# Patient Record
Sex: Male | Born: 1950 | Race: Black or African American | Hispanic: No | Marital: Married | Smoking: Never smoker
Health system: Southern US, Community
[De-identification: ages and names within clinical notes are randomized; demographics above are authoritative.]

## PROBLEM LIST (undated history)

## (undated) DIAGNOSIS — I10 Essential (primary) hypertension: Secondary | ICD-10-CM

## (undated) DIAGNOSIS — E785 Hyperlipidemia, unspecified: Secondary | ICD-10-CM

## (undated) DIAGNOSIS — I2510 Atherosclerotic heart disease of native coronary artery without angina pectoris: Secondary | ICD-10-CM

## (undated) HISTORY — PX: PTCA: SHX146

## (undated) HISTORY — PX: CORONARY ARTERY BYPASS GRAFT: SHX141

---

## 2009-05-31 DEATH — deceased

## 2012-10-19 ENCOUNTER — Encounter (HOSPITAL_BASED_OUTPATIENT_CLINIC_OR_DEPARTMENT_OTHER): Payer: Self-pay

## 2012-10-19 ENCOUNTER — Emergency Department (HOSPITAL_BASED_OUTPATIENT_CLINIC_OR_DEPARTMENT_OTHER): Payer: Medicare Other

## 2012-10-19 ENCOUNTER — Emergency Department (HOSPITAL_BASED_OUTPATIENT_CLINIC_OR_DEPARTMENT_OTHER)
Admission: EM | Admit: 2012-10-19 | Discharge: 2012-10-19 | Disposition: A | Discharge status: Home / Self Care | Payer: Medicare Other | Attending: Emergency Medicine | Admitting: Emergency Medicine

## 2012-10-19 DIAGNOSIS — R52 Pain, unspecified: Secondary | ICD-10-CM | POA: Insufficient documentation

## 2012-10-19 DIAGNOSIS — I10 Essential (primary) hypertension: Secondary | ICD-10-CM | POA: Insufficient documentation

## 2012-10-19 DIAGNOSIS — M545 Low back pain, unspecified: Secondary | ICD-10-CM | POA: Insufficient documentation

## 2012-10-19 DIAGNOSIS — Z951 Presence of aortocoronary bypass graft: Secondary | ICD-10-CM | POA: Insufficient documentation

## 2012-10-19 DIAGNOSIS — Z7902 Long term (current) use of antithrombotics/antiplatelets: Secondary | ICD-10-CM | POA: Insufficient documentation

## 2012-10-19 DIAGNOSIS — I2510 Atherosclerotic heart disease of native coronary artery without angina pectoris: Secondary | ICD-10-CM | POA: Insufficient documentation

## 2012-10-19 DIAGNOSIS — E785 Hyperlipidemia, unspecified: Secondary | ICD-10-CM | POA: Insufficient documentation

## 2012-10-19 HISTORY — DX: Hyperlipidemia, unspecified: E78.5

## 2012-10-19 HISTORY — DX: Atherosclerotic heart disease of native coronary artery without angina pectoris: I25.10

## 2012-10-19 HISTORY — DX: Essential (primary) hypertension: I10

## 2012-10-19 LAB — URINALYSIS, ROUTINE W REFLEX MICROSCOPIC
Bilirubin Urine: NEGATIVE
Hgb urine dipstick: NEGATIVE
Ketones, ur: NEGATIVE mg/dL
Nitrite: NEGATIVE
Urobilinogen, UA: 0.2 mg/dL (ref 0.0–1.0)
pH: 5.5 (ref 5.0–8.0)

## 2012-10-19 LAB — BASIC METABOLIC PANEL
CO2: 30 mEq/L (ref 19–32)
Calcium: 10.1 mg/dL (ref 8.4–10.5)
Creatinine, Ser: 0.8 mg/dL (ref 0.50–1.35)
GFR calc non Af Amer: >90 mL/min (ref >90)
Glucose, Bld: 104 mg/dL — ABNORMAL HIGH (ref 70–99)
Sodium: 141 mEq/L (ref 135–145)

## 2012-10-19 LAB — CBC
Hemoglobin: 14.1 g/dL (ref 13.0–17.0)
MCH: 31.3 pg (ref 26.0–34.0)
MCHC: 34.3 g/dL (ref 30.0–36.0)
MCV: 91.1 fL (ref 78.0–100.0)
RBC: 4.51 MIL/uL (ref 4.22–5.81)

## 2012-10-19 MED ORDER — ORPHENADRINE CITRATE 30 MG/ML IJ SOLN
60.0000 mg | Freq: Once | INTRAMUSCULAR | Status: DC
  Filled 2012-10-19: qty 2

## 2012-10-19 MED ORDER — HYDROMORPHONE HCL PF 1 MG/ML IJ SOLN
1.0000 mg | Freq: Once | INTRAMUSCULAR | Status: AC
  Administered 2012-10-19: 1 mg via INTRAVENOUS
  Filled 2012-10-19: qty 1

## 2012-10-19 MED ORDER — OXYCODONE-ACETAMINOPHEN 5-325 MG PO TABS: 1.0000 | ORAL_TABLET | ORAL | Status: AC | PRN

## 2012-10-19 NOTE — ED Notes (Signed)
MD at bedside. 

## 2012-10-19 NOTE — ED Provider Notes (Signed)
CSN: [id number]     Arrival date & time 10/19/12  4132 History     First MD Initiated Contact with Patient 10/19/12 [phone]     Chief Complaint  Patient presents with  . Back Pain   (Consider location/radiation/quality/duration/timing/severity/associated sxs/prior Treatment) Patient is a 62 y.o. male presenting with back pain. The history is provided by the patient.  Back Pain Location:  Lumbar spine Quality:  Aching Radiates to:  Does not radiate Pain severity:  Severe Pain is:  Same all the time Onset quality:  Gradual Duration:  2 days Timing:  Constant Progression:  Worsening Chronicity:  New Context: not lifting heavy objects, not MCA, not recent illness and not recent injury   Relieved by: partially by sitting up. Exacerbated by: certain positions. Associated symptoms: no abdominal pain, no bladder incontinence, no bowel incontinence, no dysuria, no fever, no leg pain, no numbness, no perianal numbness, no weakness and no weight loss   Risk factors: no hx of cancer     Past Medical History  Diagnosis Date  . Coronary artery disease   . Hypertension   . Hyperlipidemia    Past Surgical History  Procedure Laterality Date  . Coronary artery bypass graft    . Ptca     No family history on file. History  Substance Use Topics  . Smoking status: Never Smoker   . Smokeless tobacco: Not on file  . Alcohol Use: No    Review of Systems  Constitutional: Negative for fever, chills, weight loss and unexpected weight change.  Gastrointestinal: Negative for vomiting, abdominal pain and bowel incontinence.  Genitourinary: Negative for bladder incontinence, dysuria, hematuria, flank pain and decreased urine volume.  Musculoskeletal: Positive for back pain.  Neurological: Negative for weakness and numbness.  All other systems reviewed and are negative.    Allergies  Review of patient's allergies indicates no known allergies.  Home Medications   Current Outpatient Rx   Name  Route  Sig  Dispense  Refill  . Atorvastatin Calcium (LIPITOR PO)   Oral   Take by mouth.         . clopidogrel (PLAVIX) 75 MG tablet   Oral   Take 75 mg by mouth daily.         Marland Kitchen METOPROLOL TARTRATE PO   Oral   Take by mouth.          BP 150/89  Pulse 69  Temp(Src) 98.1 F (36.7 C) (Oral)  Resp 16  Ht 5\' 4"  (1.626 m)  Wt 190 lb (86.183 kg)  BMI 32.6 kg/m2  SpO2 97% Physical Exam  Nursing note and vitals reviewed. Constitutional: He is oriented to person, place, and time. He appears well-developed and well-nourished.  HENT:  Head: Normocephalic and atraumatic.  Right Ear: External ear normal.  Left Ear: External ear normal.  Nose: Nose normal.  Eyes: Right eye exhibits no discharge. Left eye exhibits no discharge.  Neck: Neck supple.  Cardiovascular: Normal rate, regular rhythm, normal heart sounds and intact distal pulses.   Pulmonary/Chest: Effort normal and breath sounds normal.  Abdominal: Soft. He exhibits no distension. There is no tenderness.  Genitourinary: Rectum normal. Rectal exam shows anal tone normal.  Musculoskeletal: He exhibits no edema.       Lumbar back: He exhibits tenderness (midline and left sided).  Neurological: He is alert and oriented to person, place, and time. He has normal strength. No sensory deficit. He exhibits normal muscle tone.  Reflex Scores:  Patellar reflexes are 2+ on the right side and 2+ on the left side.      Achilles reflexes are 2+ on the right side and 2+ on the left side. Gait with mild limp associated with pain but otherwise normal  Skin: Skin is warm and dry.    ED Course   Procedures (including critical care time)  Labs Reviewed  URINALYSIS, ROUTINE W REFLEX MICROSCOPIC - Abnormal; Notable for the following:    APPearance CLOUDY (*)    All other components within normal limits  BASIC METABOLIC PANEL - Abnormal; Notable for the following:    Glucose, Bld 104 (*)    All other components within  normal limits  CBC   Dg Lumbar Spine Complete  10/19/2012   *RADIOLOGY REPORT*  Clinical Data: Acute low back pain for 1 week, no injury, some left radiculopathy  LUMBAR SPINE - COMPLETE 4+ VIEW  Comparison: None  Findings: Five non-rib bearing lumbar vertebrae. Osseous mineralization normal. Minimal scattered endplate spur formation. Vertebral body and disc space heights fairly well maintained. No acute fracture, subluxation or bone destruction. Extensive atherosclerotic calcification. Facet degenerative changes lower lumbar spine. No spondylolysis. SI joints symmetric.  IMPRESSION: Minimal degenerative disc disease changes. No acute abnormalities.   Original Report Authenticated By: Ulyses Southward, M.D.   US Aorta  10/19/2012   *RADIOLOGY REPORT*  Clinical Data:  Low back pain  ULTRASOUND OF ABDOMINAL AORTA  Technique:  Ultrasound examination of the abdominal aorta was performed to evaluate for abdominal aortic aneurysm.  Comparison: None.  Abdominal Aorta:  No aneurysm identified.        Maximum AP diameter:  2.8 cm.       Maximum TRV diameter:  2.7 cm.  The iliac arteries measure 1.2-1.3 cm bilaterally.  IMPRESSION: No evidence of abdominal aortic aneurysm.   Original Report Authenticated By: Alcide Clever, M.D.   1. Acute low back pain     MDM  62 year old male with atraumatic, gradually worsening low back pain. No radiation. No neurologic symptoms and neuro exam is normal. Rectal exam normal. Only red flag is age. Due to this x-ray was obtained. Xray shows no acute abnormalities. Ultrasound of the aorta shows no AAA. UA neg, no hematuria to suggest atypical stone. Appears to be MSK in etiology, will treat with PO pain meds as outpatient.  Audree Camel, MD 10/19/12 [phone]

## 2012-10-19 NOTE — ED Notes (Signed)
Patient transported to X-ray 

## 2012-10-19 NOTE — ED Notes (Signed)
Pt reports low back pain x 2 days. 

## 2012-10-22 ENCOUNTER — Emergency Department (HOSPITAL_BASED_OUTPATIENT_CLINIC_OR_DEPARTMENT_OTHER)
Admission: EM | Admit: 2012-10-22 | Discharge: 2012-10-22 | Disposition: A | Discharge status: Home / Self Care | Payer: Medicare Other | Attending: Emergency Medicine | Admitting: Emergency Medicine

## 2012-10-22 ENCOUNTER — Encounter (HOSPITAL_BASED_OUTPATIENT_CLINIC_OR_DEPARTMENT_OTHER): Payer: Self-pay | Admitting: *Deleted

## 2012-10-22 DIAGNOSIS — M549 Dorsalgia, unspecified: Secondary | ICD-10-CM

## 2012-10-22 DIAGNOSIS — M545 Low back pain, unspecified: Secondary | ICD-10-CM | POA: Insufficient documentation

## 2012-10-22 DIAGNOSIS — Z951 Presence of aortocoronary bypass graft: Secondary | ICD-10-CM | POA: Insufficient documentation

## 2012-10-22 DIAGNOSIS — Z79899 Other long term (current) drug therapy: Secondary | ICD-10-CM | POA: Insufficient documentation

## 2012-10-22 DIAGNOSIS — Z7902 Long term (current) use of antithrombotics/antiplatelets: Secondary | ICD-10-CM | POA: Insufficient documentation

## 2012-10-22 DIAGNOSIS — E785 Hyperlipidemia, unspecified: Secondary | ICD-10-CM | POA: Insufficient documentation

## 2012-10-22 DIAGNOSIS — I10 Essential (primary) hypertension: Secondary | ICD-10-CM | POA: Insufficient documentation

## 2012-10-22 DIAGNOSIS — I2510 Atherosclerotic heart disease of native coronary artery without angina pectoris: Secondary | ICD-10-CM | POA: Insufficient documentation

## 2012-10-22 MED ORDER — CIPROFLOXACIN HCL 500 MG PO TABS: 500.0000 mg | ORAL_TABLET | Freq: Two times a day (BID) | ORAL | Status: AC

## 2012-10-22 MED ORDER — OXYCODONE-ACETAMINOPHEN 5-325 MG PO TABS: 1.0000 | ORAL_TABLET | ORAL | Status: AC | PRN

## 2012-10-22 MED ORDER — OXYCODONE-ACETAMINOPHEN 5-325 MG PO TABS
1.0000 | ORAL_TABLET | Freq: Once | ORAL | Status: AC
  Administered 2012-10-22: 1 via ORAL
  Filled 2012-10-22 (×2): qty 1

## 2012-10-22 NOTE — ED Provider Notes (Signed)
CSN: [id number]     Arrival date & time 10/22/12  1204 History     First MD Initiated Contact with Patient 10/22/12 1345     Chief Complaint  Patient presents with  . Back Pain   (Consider location/radiation/quality/duration/timing/severity/associated sxs/prior Treatment) Patient is a 62 y.o. male presenting with back pain.  Back Pain Location:  Lumbar spine Quality:  Aching Radiates to:  Does not radiate Pain severity:  Severe Onset quality:  Gradual Duration:  4 days Timing:  Constant Progression:  Unchanged Chronicity:  New Context: not lifting heavy objects, not MCA and not twisting   Relieved by:  Narcotics Worsened by:  Twisting and bending Associated symptoms: no abdominal pain, no bladder incontinence, no bowel incontinence, no dysuria, no fever, no numbness, no perianal numbness, no tingling and no weakness     Past Medical History  Diagnosis Date  . Coronary artery disease   . Hypertension   . Hyperlipidemia    Past Surgical History  Procedure Laterality Date  . Coronary artery bypass graft    . Ptca     No family history on file. History  Substance Use Topics  . Smoking status: Never Smoker   . Smokeless tobacco: Not on file  . Alcohol Use: No    Review of Systems  Constitutional: Negative for fever.  Gastrointestinal: Negative for abdominal pain and bowel incontinence.  Genitourinary: Negative for bladder incontinence and dysuria.  Musculoskeletal: Positive for back pain.  Neurological: Negative for tingling, weakness and numbness.  All other systems reviewed and are negative.    Allergies  Review of patient's allergies indicates no known allergies.  Home Medications   Current Outpatient Rx  Name  Route  Sig  Dispense  Refill  . Atorvastatin Calcium (LIPITOR PO)   Oral   Take by mouth.         . clopidogrel (PLAVIX) 75 MG tablet   Oral   Take 75 mg by mouth daily.         Marland Kitchen METOPROLOL TARTRATE PO   Oral   Take by mouth.        . oxyCODONE-acetaminophen (PERCOCET) 5-325 MG per tablet   Oral   Take 1 tablet by mouth every 4 (four) hours as needed for pain.   15 tablet   0    BP 131/78  Pulse 110  Temp(Src) 97.7 F (36.5 C) (Oral)  Resp 20  Ht 5\' 4"  (1.626 m)  Wt 190 lb (86.183 kg)  BMI 32.6 kg/m2  SpO2 99% Physical Exam  Nursing note and vitals reviewed. Constitutional: He is oriented to person, place, and time. He appears well-developed and well-nourished. No distress.  HENT:  Head: Normocephalic and atraumatic.  Mouth/Throat: Oropharynx is clear and moist.  Eyes: Conjunctivae are normal. Pupils are equal, round, and reactive to light. No scleral icterus.  Neck: Neck supple.  Cardiovascular: Normal rate, regular rhythm, normal heart sounds and intact distal pulses.   No murmur heard. Pulmonary/Chest: Effort normal and breath sounds normal. No stridor. No respiratory distress. He has no wheezes. He has no rales.  Abdominal: Soft. He exhibits no distension. There is no tenderness. There is no rigidity, no rebound, no guarding and no CVA tenderness.  Genitourinary: Rectal exam shows no mass and anal tone normal. Prostate is tender ( prostate tender but not boggy ).  Musculoskeletal: Normal range of motion. He exhibits no edema.       Lumbar back: He exhibits tenderness.  Back:  Neurological: He is alert and oriented to person, place, and time. He has normal strength. Gait normal.  Reflex Scores:      Patellar reflexes are 2+ on the right side and 2+ on the left side. Skin: Skin is warm and dry. No rash noted.  Psychiatric: He has a normal mood and affect. His behavior is normal.    ED Course   Procedures (including critical care time)  Labs Reviewed - No data to display No results found. 1. Back pain     MDM  62 yo male with 5 days of low back pain.  Evaluated in ED several days ago and had negative plain film and negative aorta ultrasound.  Labs reviewed from that visit.   Pain  persisted, but no indication for emergent MRI.  Prostate is tender and he feels that palpation reproduced pain.  He is nontoxic and afebrile, but plan to treat with abx for possible prostatitis.  I told him to follow up Monday either here or at his PCP's office for a recheck given diagnostic uncertainty.    Candyce Churn, MD 10/22/12 [phone]

## 2012-10-22 NOTE — ED Notes (Addendum)
Patient here for back pain and was seen Wednesday. Was scanned on that day. States pain isnt any better/ Patient was prescribed percocet and brought in his empty bottle.

## 2014-12-07 ENCOUNTER — Other Ambulatory Visit: Payer: Self-pay

## 2015-08-18 IMAGING — CR DG LUMBAR SPINE COMPLETE 4+V
5 series · 5 of 5 positions shown · non-contrast
Comparison: None

CLINICAL DATA: Acute low back pain for 1 week, no injury, some left
radiculopathy

LUMBAR SPINE - COMPLETE 4+ VIEW

[t l-spine a.p.]
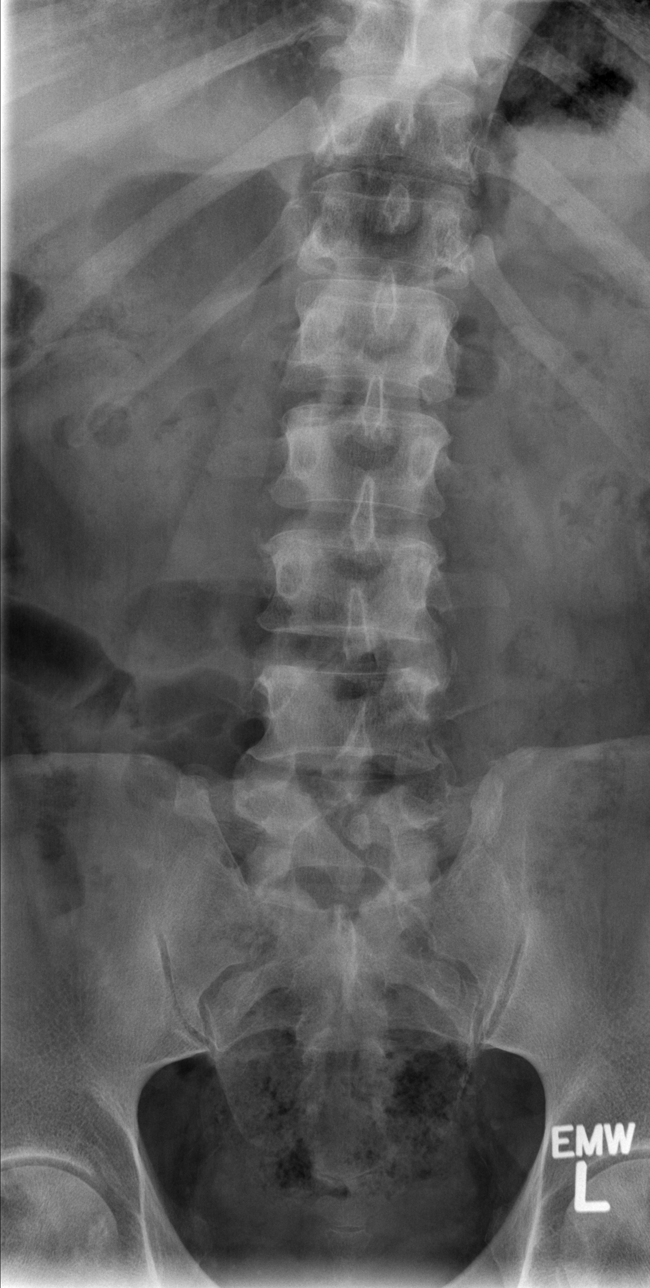

[t l-spine oblique exposure (1 of 2)]
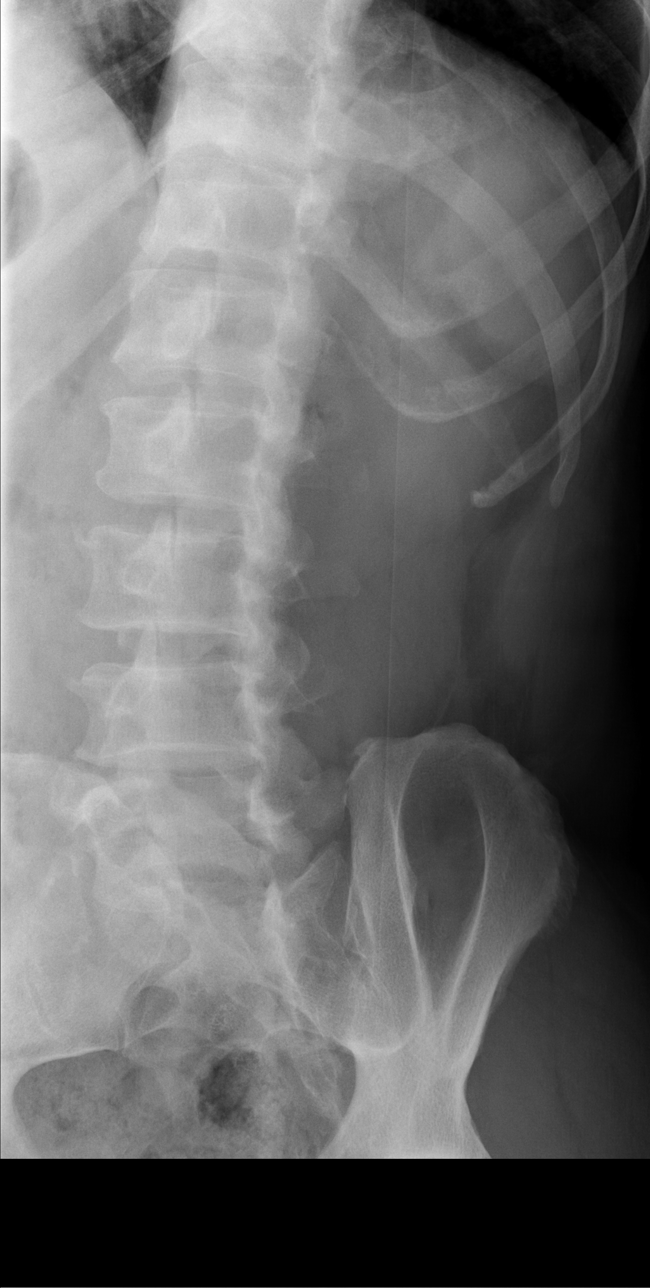

[t l-spine oblique exposure (2 of 2)]
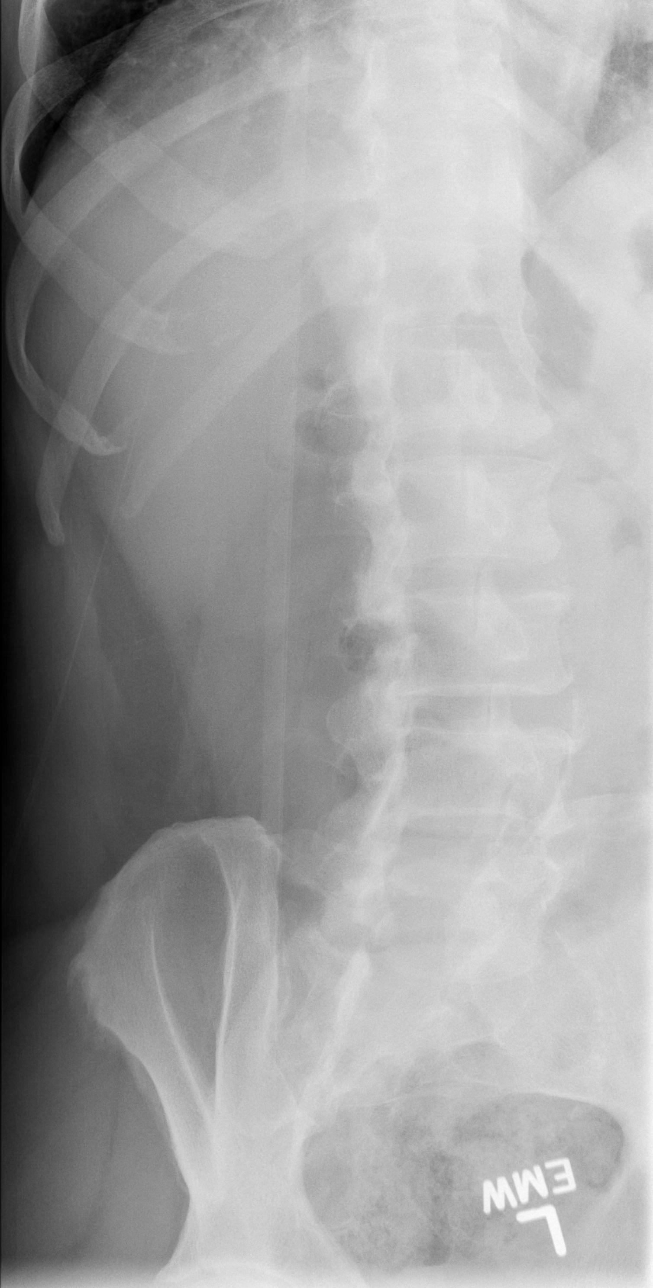

[t l-spine lat]
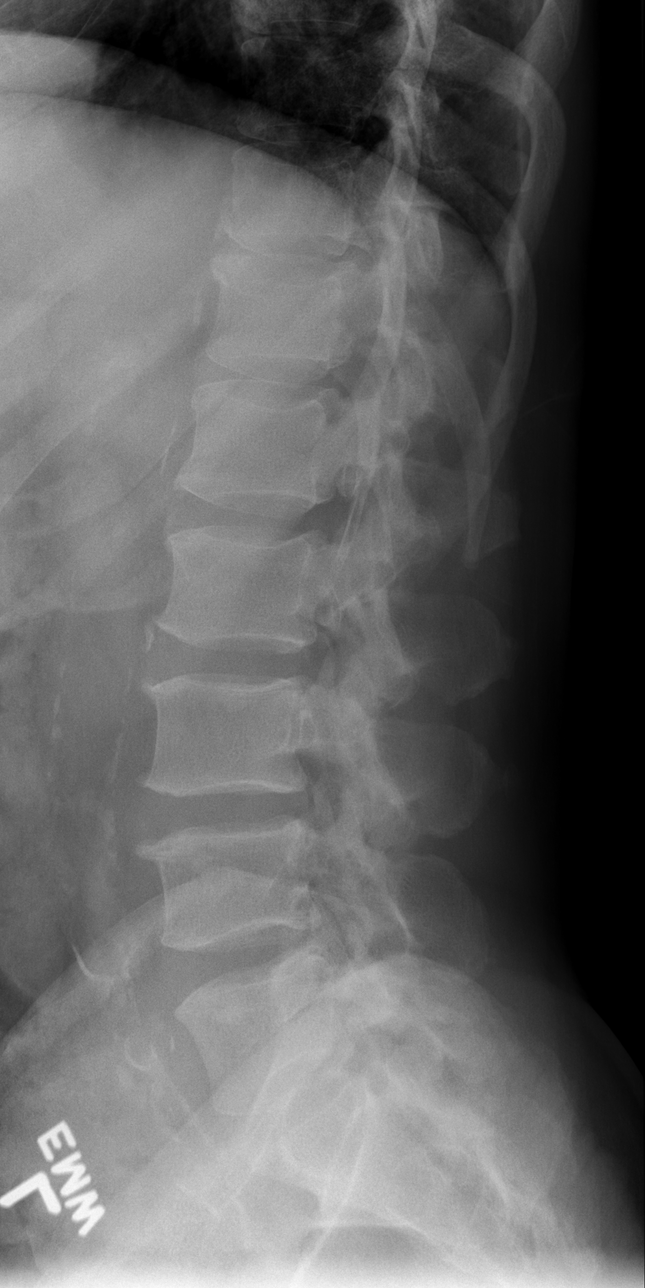

[t l-spine l5-s1 spot]
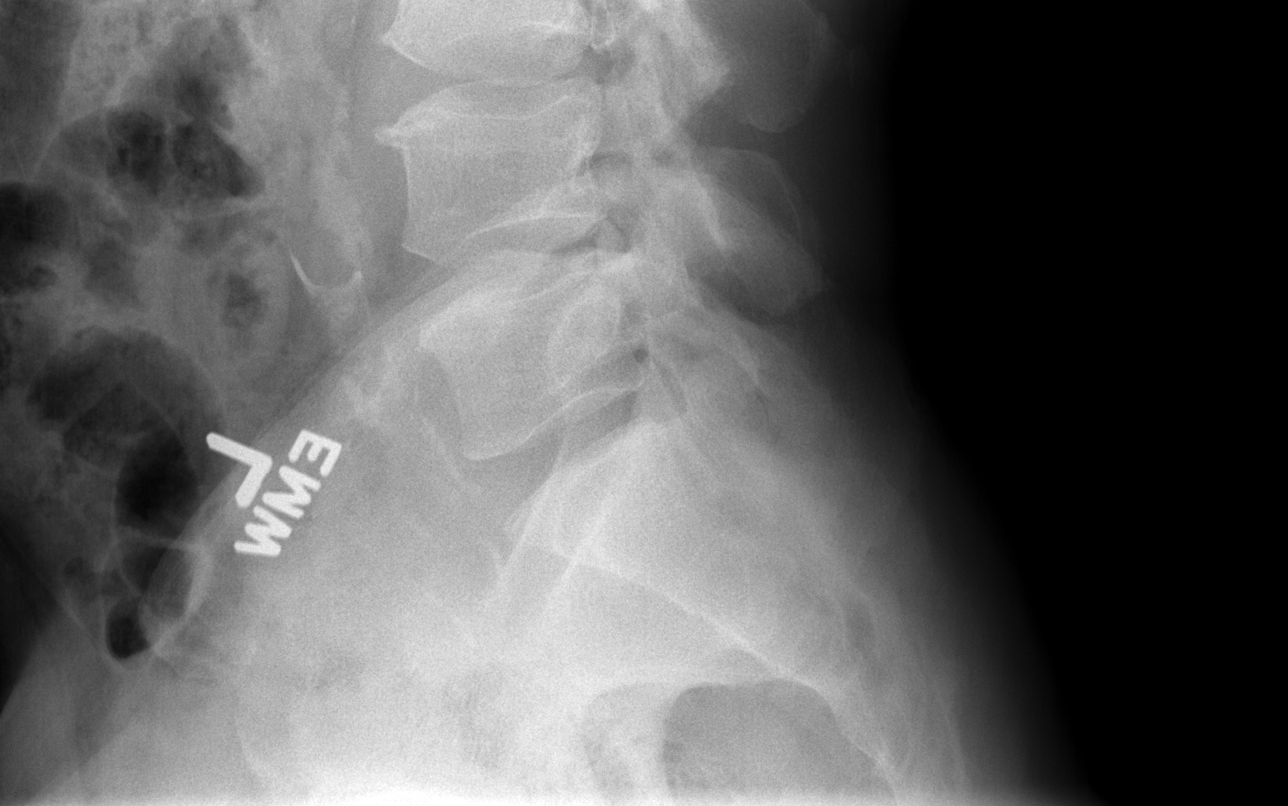

[5 of 5 positions shown; findings below may reference images not displayed]

FINDINGS: Five non-rib bearing lumbar vertebrae.
Osseous mineralization normal.
Minimal scattered endplate spur formation.
Vertebral body and disc space heights fairly well maintained.
No acute fracture, subluxation or bone destruction.
Extensive atherosclerotic calcification.
Facet degenerative changes lower lumbar spine.
No spondylolysis.
SI joints symmetric.
IMPRESSION: Minimal degenerative disc disease changes.
No acute abnormalities.

## 2021-09-24 ENCOUNTER — Emergency Department (HOSPITAL_BASED_OUTPATIENT_CLINIC_OR_DEPARTMENT_OTHER): Payer: No Typology Code available for payment source

## 2021-09-24 ENCOUNTER — Encounter (HOSPITAL_BASED_OUTPATIENT_CLINIC_OR_DEPARTMENT_OTHER): Payer: Self-pay | Admitting: Emergency Medicine

## 2021-09-24 ENCOUNTER — Emergency Department (HOSPITAL_BASED_OUTPATIENT_CLINIC_OR_DEPARTMENT_OTHER)
Admission: EM | Admit: 2021-09-24 | Discharge: 2021-09-24 | Disposition: A | Discharge status: Home / Self Care | Payer: No Typology Code available for payment source | Attending: Emergency Medicine | Admitting: Emergency Medicine

## 2021-09-24 DIAGNOSIS — Z7902 Long term (current) use of antithrombotics/antiplatelets: Secondary | ICD-10-CM | POA: Diagnosis not present

## 2021-09-24 DIAGNOSIS — S60312A Abrasion of left thumb, initial encounter: Secondary | ICD-10-CM | POA: Insufficient documentation

## 2021-09-24 DIAGNOSIS — Y92410 Unspecified street and highway as the place of occurrence of the external cause: Secondary | ICD-10-CM | POA: Insufficient documentation

## 2021-09-24 DIAGNOSIS — S6992XA Unspecified injury of left wrist, hand and finger(s), initial encounter: Secondary | ICD-10-CM | POA: Diagnosis present

## 2021-09-24 DIAGNOSIS — M545 Low back pain, unspecified: Secondary | ICD-10-CM | POA: Insufficient documentation

## 2021-09-24 MED ORDER — TRAMADOL HCL 50 MG PO TABS: 50.0000 mg | ORAL_TABLET | Freq: Four times a day (QID) | ORAL | 0 refills | Status: AC | PRN

## 2021-09-24 MED ORDER — METHOCARBAMOL 500 MG PO TABS: 500.0000 mg | ORAL_TABLET | Freq: Three times a day (TID) | ORAL | 0 refills | Status: AC | PRN

## 2021-09-24 MED ORDER — OXYCODONE-ACETAMINOPHEN 5-325 MG PO TABS
1.0000 | ORAL_TABLET | Freq: Once | ORAL | Status: AC
  Administered 2021-09-24: 1 via ORAL
  Filled 2021-09-24: qty 1
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
## 2021-09-24 NOTE — Discharge Instructions (Addendum)
